# Patient Record
Sex: Female | Born: 1990 | Race: White | Hispanic: No | Marital: Single | Smoking: Never smoker
Health system: Southern US, Community
[De-identification: ages and names within clinical notes are randomized; demographics above are authoritative.]

---

## 2013-12-29 ENCOUNTER — Telehealth: Payer: Self-pay | Admitting: Family Medicine

## 2013-12-29 NOTE — Telephone Encounter (Signed)
appt given for thurs 

## 2014-01-01 ENCOUNTER — Ambulatory Visit (INDEPENDENT_AMBULATORY_CARE_PROVIDER_SITE_OTHER): Payer: 59 | Admitting: Family

## 2014-01-01 ENCOUNTER — Encounter: Payer: Self-pay | Admitting: Family

## 2014-01-01 VITALS — BP 105/72 | HR 93 | Temp 98.0°F | Ht <= 58 in | Wt 121.6 lb

## 2014-01-01 DIAGNOSIS — N63 Unspecified lump in unspecified breast: Secondary | ICD-10-CM

## 2014-01-01 NOTE — Patient Instructions (Signed)

## 2014-01-01 NOTE — Progress Notes (Signed)
   Subjective:    Patient ID: Victoria Butler, female    DOB: 1990-10-18, 23 y.o.   MRN: [id number]  HPI Pt presents to the office for a lump in her right breast. PT states she noticed it a couple of weeks ago. PT denies any pain or tenderness in either breast. Pt states her grandmother's sister was diagnosed with breast cancer last year.    Review of Systems  Constitutional: Negative.   HENT: Negative.   Eyes: Negative.   Respiratory: Negative.  Negative for shortness of breath.   Cardiovascular: Negative.  Negative for palpitations.  Gastrointestinal: Negative.   Endocrine: Negative.   Genitourinary: Negative.   Musculoskeletal: Negative.   Neurological: Negative.  Negative for headaches.  Hematological: Negative.   Psychiatric/Behavioral: Negative.   All other systems reviewed and are negative.      Objective:   Physical Exam  Vitals reviewed. Constitutional: She is oriented to person, place, and time. She appears well-developed and well-nourished. No distress.  Neck: Normal range of motion. Neck supple. No thyromegaly present.  Cardiovascular: Normal rate, regular rhythm, normal heart sounds and intact distal pulses.   No murmur heard. Pulmonary/Chest: Effort normal and breath sounds normal. No respiratory distress. She has no wheezes. She exhibits no tenderness and no bony tenderness. Right breast exhibits no inverted nipple, no nipple discharge, no skin change and no tenderness. Mass: Lump felt on upper part of bilateral breast. Left breast exhibits no inverted nipple, no nipple discharge, no skin change and no tenderness.  Abdominal: Soft. Bowel sounds are normal. She exhibits no distension. There is no tenderness.  Musculoskeletal: Normal range of motion. She exhibits no edema and no tenderness.  Neurological: She is alert and oriented to person, place, and time. She has normal reflexes. No cranial nerve deficit.  Skin: Skin is warm and dry.  Psychiatric: She has a normal mood  and affect. Her behavior is normal. Judgment and thought content normal.   BP 105/72  Pulse 93  Temp(Src) 98 F (36.7 C) (Oral)  Ht 1' (0.305 m)  Wt 121 lb 9.6 oz (55.157 kg)  BMI 592.93 kg/m2       Assessment & Plan:  1. Lump in female breast -Both breast "lumpy" and I do not think that there is anything to worry about- But will rule out  -RTO prn - Ambulatory referral to Breast Clinic  Jannifer Rodney, FNP

## 2014-01-06 ENCOUNTER — Other Ambulatory Visit: Payer: Self-pay | Admitting: Family

## 2014-01-06 DIAGNOSIS — N6310 Unspecified lump in the right breast, unspecified quadrant: Secondary | ICD-10-CM

## 2014-01-13 ENCOUNTER — Ambulatory Visit (HOSPITAL_COMMUNITY)
Admission: RE | Admit: 2014-01-13 | Discharge: 2014-01-13 | Disposition: A | Discharge status: Home / Self Care | Payer: 59 | Source: Ambulatory Visit | Attending: Family | Admitting: Family

## 2014-01-13 DIAGNOSIS — N6459 Other signs and symptoms in breast: Secondary | ICD-10-CM | POA: Diagnosis present

## 2014-01-13 DIAGNOSIS — N63 Unspecified lump in breast: Secondary | ICD-10-CM | POA: Insufficient documentation

## 2014-01-13 DIAGNOSIS — N6310 Unspecified lump in the right breast, unspecified quadrant: Secondary | ICD-10-CM
